# Patient Record
Sex: Female | Born: 1950
Health system: Southern US, Community
[De-identification: ages and names within clinical notes are randomized; demographics above are authoritative.]

## PROBLEM LIST (undated history)

## (undated) DIAGNOSIS — E039 Hypothyroidism, unspecified: Secondary | ICD-10-CM

## (undated) DIAGNOSIS — E785 Hyperlipidemia, unspecified: Secondary | ICD-10-CM

## (undated) DIAGNOSIS — F32A Depression, unspecified: Secondary | ICD-10-CM

## (undated) DIAGNOSIS — Z8679 Personal history of other diseases of the circulatory system: Secondary | ICD-10-CM

## (undated) HISTORY — DX: Hypothyroidism, unspecified: E03.9

## (undated) HISTORY — DX: Personal history of other diseases of the circulatory system: Z86.79

## (undated) HISTORY — DX: Depression, unspecified: F32.A

## (undated) HISTORY — DX: Hyperlipidemia, unspecified: E78.5

---

## 2000-02-03 ENCOUNTER — Other Ambulatory Visit: Admission: RE | Admit: 2000-02-03 | Discharge: 2000-02-03 | Payer: Self-pay | Admitting: Family Medicine

## 2000-02-12 ENCOUNTER — Ambulatory Visit (HOSPITAL_COMMUNITY): Admission: RE | Admit: 2000-02-12 | Discharge: 2000-02-12 | Payer: Self-pay | Admitting: Family Medicine

## 2000-02-12 ENCOUNTER — Encounter: Payer: Self-pay | Admitting: Family Medicine

## 2001-04-07 ENCOUNTER — Other Ambulatory Visit: Admission: RE | Admit: 2001-04-07 | Discharge: 2001-04-07 | Payer: Self-pay | Admitting: Family Medicine

## 2002-04-12 ENCOUNTER — Other Ambulatory Visit: Admission: RE | Admit: 2002-04-12 | Discharge: 2002-04-12 | Payer: Self-pay | Admitting: Family Medicine

## 2003-05-17 ENCOUNTER — Encounter: Payer: Self-pay | Admitting: Family Medicine

## 2003-05-17 ENCOUNTER — Ambulatory Visit (HOSPITAL_COMMUNITY): Admission: RE | Admit: 2003-05-17 | Discharge: 2003-05-17 | Payer: Self-pay | Admitting: Family Medicine

## 2003-06-12 ENCOUNTER — Other Ambulatory Visit: Admission: RE | Admit: 2003-06-12 | Discharge: 2003-06-12 | Payer: Self-pay | Admitting: Family Medicine

## 2004-07-11 ENCOUNTER — Other Ambulatory Visit: Admission: RE | Admit: 2004-07-11 | Discharge: 2004-07-11 | Payer: Self-pay | Admitting: Family Medicine

## 2005-07-16 ENCOUNTER — Other Ambulatory Visit: Admission: RE | Admit: 2005-07-16 | Discharge: 2005-07-16 | Payer: Self-pay | Admitting: Family Medicine

## 2006-04-21 ENCOUNTER — Ambulatory Visit (HOSPITAL_COMMUNITY): Admission: RE | Admit: 2006-04-21 | Discharge: 2006-04-21 | Payer: Self-pay | Admitting: Family Medicine

## 2006-08-06 ENCOUNTER — Other Ambulatory Visit: Admission: RE | Admit: 2006-08-06 | Discharge: 2006-08-06 | Payer: Self-pay | Admitting: Family Medicine

## 2007-11-08 ENCOUNTER — Other Ambulatory Visit: Admission: RE | Admit: 2007-11-08 | Discharge: 2007-11-08 | Payer: Self-pay | Admitting: Family Medicine

## 2008-11-08 ENCOUNTER — Other Ambulatory Visit: Admission: RE | Admit: 2008-11-08 | Discharge: 2008-11-08 | Payer: Self-pay | Admitting: Family Medicine

## 2009-11-21 ENCOUNTER — Other Ambulatory Visit: Admission: RE | Admit: 2009-11-21 | Discharge: 2009-11-21 | Payer: Self-pay | Admitting: Family Medicine

## 2011-03-26 ENCOUNTER — Inpatient Hospital Stay (INDEPENDENT_AMBULATORY_CARE_PROVIDER_SITE_OTHER)
Admission: RE | Admit: 2011-03-26 | Discharge: 2011-03-26 | Disposition: A | Discharge status: Home / Self Care | Payer: 59 | Source: Ambulatory Visit | Attending: Family Medicine | Admitting: Family Medicine

## 2011-03-26 DIAGNOSIS — J069 Acute upper respiratory infection, unspecified: Secondary | ICD-10-CM

## 2011-03-26 DIAGNOSIS — R509 Fever, unspecified: Secondary | ICD-10-CM

## 2012-08-25 ENCOUNTER — Other Ambulatory Visit: Payer: Self-pay | Admitting: Family Medicine

## 2012-08-25 ENCOUNTER — Other Ambulatory Visit (HOSPITAL_COMMUNITY)
Admission: RE | Admit: 2012-08-25 | Discharge: 2012-08-25 | Disposition: A | Discharge status: Home / Self Care | Payer: 59 | Source: Ambulatory Visit | Attending: Family Medicine | Admitting: Family Medicine

## 2012-08-25 DIAGNOSIS — Z124 Encounter for screening for malignant neoplasm of cervix: Secondary | ICD-10-CM | POA: Insufficient documentation

## 2013-12-21 ENCOUNTER — Other Ambulatory Visit (HOSPITAL_COMMUNITY): Payer: Self-pay | Admitting: Family Medicine

## 2013-12-21 DIAGNOSIS — E2839 Other primary ovarian failure: Secondary | ICD-10-CM

## 2013-12-21 DIAGNOSIS — R0989 Other specified symptoms and signs involving the circulatory and respiratory systems: Secondary | ICD-10-CM

## 2013-12-28 ENCOUNTER — Other Ambulatory Visit (HOSPITAL_COMMUNITY): Payer: 59

## 2013-12-28 ENCOUNTER — Ambulatory Visit (HOSPITAL_COMMUNITY): Payer: 59

## 2014-01-04 ENCOUNTER — Ambulatory Visit (HOSPITAL_COMMUNITY)
Admission: RE | Admit: 2014-01-04 | Discharge: 2014-01-04 | Disposition: A | Discharge status: Home / Self Care | Payer: 59 | Source: Ambulatory Visit | Attending: Family Medicine | Admitting: Family Medicine

## 2014-01-04 DIAGNOSIS — R0989 Other specified symptoms and signs involving the circulatory and respiratory systems: Secondary | ICD-10-CM | POA: Insufficient documentation

## 2014-01-04 DIAGNOSIS — I6529 Occlusion and stenosis of unspecified carotid artery: Secondary | ICD-10-CM | POA: Insufficient documentation

## 2015-03-22 ENCOUNTER — Other Ambulatory Visit: Payer: Self-pay | Admitting: Family Medicine

## 2015-03-22 ENCOUNTER — Other Ambulatory Visit (HOSPITAL_COMMUNITY)
Admission: RE | Admit: 2015-03-22 | Discharge: 2015-03-22 | Disposition: A | Discharge status: Home / Self Care | Payer: 59 | Source: Ambulatory Visit | Attending: Family Medicine | Admitting: Family Medicine

## 2015-03-22 DIAGNOSIS — Z124 Encounter for screening for malignant neoplasm of cervix: Secondary | ICD-10-CM | POA: Diagnosis not present

## 2015-03-23 LAB — CYTOLOGY - PAP

## 2015-09-26 ENCOUNTER — Ambulatory Visit: Payer: PPO | Attending: Sports Medicine | Admitting: Physical Therapy

## 2015-09-26 DIAGNOSIS — M25512 Pain in left shoulder: Secondary | ICD-10-CM | POA: Diagnosis not present

## 2015-09-26 DIAGNOSIS — M25612 Stiffness of left shoulder, not elsewhere classified: Secondary | ICD-10-CM

## 2015-09-26 DIAGNOSIS — M7582 Other shoulder lesions, left shoulder: Secondary | ICD-10-CM | POA: Diagnosis not present

## 2015-09-26 NOTE — Therapy (Signed)
[facility] [address] [place], [place], [address] Phone: [phone]   Fax:  [phone]  Physical Therapy Evaluation  Patient Details  Name: Erin Chavez MRN: [id number] Date of Birth: 1951-04-05 Referring Provider: Rodolph Bong MD.  Encounter Date: 09/26/2015      PT End of Session - 09/26/15 1302    Visit Number 1   Number of Visits 12   Date for PT Re-Evaluation 11/07/15   PT Start Time 1030   PT Stop Time 1119   PT Time Calculation (min) 49 min   Activity Tolerance Patient tolerated treatment well   Behavior During Therapy [facility] for tasks assessed/performed      No past medical history on file.  No past surgical history on file.  There were no vitals filed for this visit.  Visit Diagnosis:  Left shoulder pain - Plan: PT plan of care cert/re-cert  Decreased range of motion of left shoulder - Plan: PT plan of care cert/re-cert      Subjective Assessment - 09/26/15 1039    Subjective Left shoulder pain since october 2016.   Limitations --  Reaching.   Patient Stated Goals Get out of pain so i can move like i want to.   Currently in Pain? No/denies   Pain Score 0-No pain            [facility] PT Assessment - 09/26/15 0001    Assessment   Medical Diagnosis Left shoulder pain.   Referring Provider Rodolph Bong MD.   Onset Date/Surgical Date --  October 2016.   Precautions   Precautions None   Restrictions   Weight Bearing Restrictions No   Balance Screen   Has the patient fallen in the past 6 months No   Has the patient had a decrease in activity level because of a fear of falling?  No   Is the patient reluctant to leave their home because of a fear of falling?  No   Home Environment   Living Environment Private residence   Prior Function   Level of Independence Independent   Posture/Postural Control   Posture/Postural Control Postural limitations   Postural Limitations Rounded Shoulders;Forward head   ROM / Strength   AROM / PROM / Strength AROM;PROM;Strength   AROM   Overall AROM Comments Left shoulder flexion= 120 degrees; ER= 70 degrees and behind back to L5.   PROM   Overall PROM Comments Flexion= 140 degrees and ER= 80 degrees.   Strength   Overall Strength Comments Left shoulder IR/ER/Flex/ABD= 4/5.   Palpation   Palpation comment Tender to palpation at left acromial ridge with referred pain into left bicep region.   Special Tests    Special Tests Rotator Cuff Impingement   Rotator Cuff Impingment tests Neer impingement test;Hawkins- Kennedy test;Drop Arm test;Speed's test   Neer Impingement test    Findings Positive   Side Left   Hawkins-Kennedy test   Findings Positive   Side Left   Drop Arm test   Findings Negative   Side Left   Speed's test   Findings Negative   Side Left                   [facility] Adult PT Treatment/Exercise - 09/26/15 0001    Exercises   Exercises Shoulder   Modalities   Modalities Electrical Stimulation   Electrical Stimulation   Electrical Stimulation Location Left shoulder acromial ridge.   Electrical Stimulation Action 80-150 HZ x 15 minutes.  Electrical Stimulation Goals Pain                  PT Short Term Goals - 09/26/15 1252    PT SHORT TERM GOAL #1   Title Ind with HEP.   Time 2   Period Weeks   Status New           PT Long Term Goals - 09/26/15 1252    PT LONG TERM GOAL #1   Title Active left shoulder flexion to 145 degrees so she can reach overhead for an object.   Time 6   Period Weeks   Status New   PT LONG TERM GOAL #2   Title Behind back movement to L2 to make apparel donning/doffing easier.   Time 6   Period Weeks   Status New   PT LONG TERM GOAL #3   Title Left shoulder strength= 5/5 to increase left shoulder stability for functional tasks.   Time 6   Period Weeks   Status New   PT LONG TERM GOAL #4   Title Perform ADL's with pain not > 3/10.               Plan - 09/26/15  1040    Clinical Impression Statement October 2016 patient rolled over in bed and felt left shoulder pain.  No pain at rest but certain movements produce 8+/10.  Patient does not recall any specific injury.  pain radiates from her left shoulder to biceps region.   Pt will benefit from skilled therapeutic intervention in order to improve on the following deficits Pain;Decreased range of motion;Decreased activity tolerance   Rehab Potential Good   PT Frequency 2x / week   PT Duration 6 weeks   PT Treatment/Interventions ADLs/Self Care Home Management;Electrical Stimulation;Cryotherapy;Therapeutic exercise;Therapeutic activities;Ultrasound;Patient/family education;Manual techniques;Vasopneumatic Device;Passive range of motion   PT Next Visit Plan Modalities and STW/M to patient's left shoulder.  Please start with yellow theraband IR/ER...progress exercise with full can; sdly ER and scapular strengthening.  No pulleys but UR Ranger okay.   Consulted and Agree with Plan of Care Patient          G-Codes - 09/26/15 1254    Functional Assessment Tool Used FOTO...50% limitation.   Functional Limitation Other PT primary   Other PT Primary Current Status (W0981(G8990) At least 40 percent but less than 60 percent impaired, limited or restricted   Other PT Primary Goal Status (X9147(G8991) At least 20 percent but less than 40 percent impaired, limited or restricted       Problem List There are no active problems to display for this patient.   Tyreece Gelles, [place]  MPT  09/26/2015, 1:05 PM  [facility] [address] [place], [place], [address] Phone: [phone]   Fax:  [phone]  Name: Rollene FareVicki D Tisdel MRN: [id number] Date of Birth: 1951-06-26

## 2015-09-28 DIAGNOSIS — R609 Edema, unspecified: Secondary | ICD-10-CM | POA: Diagnosis not present

## 2015-10-02 ENCOUNTER — Encounter: Payer: Self-pay | Admitting: *Deleted

## 2015-10-02 ENCOUNTER — Ambulatory Visit: Payer: PPO | Admitting: *Deleted

## 2015-10-02 DIAGNOSIS — M25512 Pain in left shoulder: Secondary | ICD-10-CM | POA: Diagnosis not present

## 2015-10-02 DIAGNOSIS — M25612 Stiffness of left shoulder, not elsewhere classified: Secondary | ICD-10-CM

## 2015-10-02 NOTE — Therapy (Signed)
[facility] [address] [place], [place], [address] Phone: [phone]   Fax:  [phone]  Physical Therapy Treatment  Patient Details  Name: Rollene FareVicki D Kirst MRN: [id number] Date of Birth: 25-Jul-1951 Referring Provider: Rodolph BongAdam Kendall MD.  Encounter Date: 10/02/2015      PT End of Session - 10/02/15 1559    Visit Number 2   Number of Visits 12   Date for PT Re-Evaluation 11/07/15   PT Start Time 1033   PT Stop Time 1119   PT Time Calculation (min) 46 min      History reviewed. No pertinent past medical history.  History reviewed. No pertinent past surgical history.  There were no vitals filed for this visit.  Visit Diagnosis:  Left shoulder pain  Decreased range of motion of left shoulder      Subjective Assessment - 10/02/15 1034    Subjective Left shoulder pain since october 2016.  LT shldr hurts with certain movements   Currently in Pain? Yes   Pain Score 4    Pain Location Shoulder   Pain Orientation Left   Pain Type Acute pain   Pain Onset More than a month ago   Pain Frequency Intermittent                         [facility] Adult PT Treatment/Exercise - 10/02/15 0001    Exercises   Exercises Shoulder   Shoulder Exercises: Standing   External Rotation Strengthening;Left;Theraband  3x 10-15   Theraband Level (Shoulder External Rotation) Level 1 (Yellow)   Internal Rotation Strengthening;Left;Theraband  3x 10-15 reps   Theraband Level (Shoulder Internal Rotation) Level 1 (Yellow)   Modalities   Modalities Electrical Stimulation;Ultrasound;Moist Heat   Moist Heat Therapy   Number Minutes Moist Heat 15 Minutes   Moist Heat Location Shoulder   Electrical Stimulation   Electrical Stimulation Location Left shoulder acromial ridge Premod x 15 mins 80-150hz  in sitting   Electrical Stimulation Goals Pain   Ultrasound   Ultrasound Location LT shldr   Ultrasound Parameters 1.5 w/ cm2 x 10 mins   Ultrasound Goals Pain   Manual Therapy   Manual Therapy Myofascial release;Soft tissue mobilization   Myofascial Release IASTM? STW to LT shldr posteriolateral aspect in sitting                PT Education - 10/02/15 1046    Education provided Yes   Education Details ER/IR   Person(s) Educated Patient   Methods Explanation;Demonstration;Tactile cues;Verbal cues;Handout   Comprehension Verbalized understanding;Returned demonstration          PT Short Term Goals - 09/26/15 1252    PT SHORT TERM GOAL #1   Title Ind with HEP.   Time 2   Period Weeks   Status New           PT Long Term Goals - 09/26/15 1252    PT LONG TERM GOAL #1   Title Active left shoulder flexion to 145 degrees so she can reach overhead for an object.   Time 6   Period Weeks   Status New   PT LONG TERM GOAL #2   Title Behind back movement to L2 to make apparel donning/doffing easier.   Time 6   Period Weeks   Status New   PT LONG TERM GOAL #3   Title Left shoulder strength= 5/5 to increase left shoulder stability for functional tasks.   Time 6   Period  Weeks   Status New   PT LONG TERM GOAL #4   Title Perform ADL's with pain not > 3/10.               Plan - 10/02/15 1600    Clinical Impression Statement Pt did great with Rx today and was able to perform IR/ER with yellow Tband with minimal pain.    Pt will benefit from skilled therapeutic intervention in order to improve on the following deficits Pain;Decreased range of motion;Decreased activity tolerance   Rehab Potential Good   PT Frequency 2x / week   PT Duration 6 weeks   PT Treatment/Interventions ADLs/Self Care Home Management;Electrical Stimulation;Cryotherapy;Therapeutic exercise;Therapeutic activities;Ultrasound;Patient/family education;Manual techniques;Vasopneumatic Device;Passive range of motion   PT Next Visit Plan Modalities and STW/M to patient's left shoulder.  Please start with yellow theraband IR/ER...progress  exercise with full can; sdly ER and scapular strengthening.  No pulleys but UR Ranger okay.   Consulted and Agree with Plan of Care Patient        Problem List There are no active problems to display for this patient.   Aayansh Codispoti,CHRIS , PTA  10/02/2015, 5:37 PM  [facility] [address] [place], [place], [address] Phone: [phone]   Fax:  [phone]  Name: ANZLEE HINESLEY MRN: [id number] Date of Birth: [birth year]

## 2015-10-02 NOTE — Patient Instructions (Signed)
  Strengthening: Resisted Flexion   Hold tubing with left arm at side. Pull forward and up. Move shoulder through pain-free range of motion. Repeat __10__ times per set. Do _2-3___ sets per session. Do _2-3___ sessions per day. http://orth.exer.us/824   Copyright  VHI. All rights reserved.  Strengthening: Resisted Extension   Hold tubing in right hand, arm forward. Pull arm back, elbow straight. Repeat __10__ times per set. Do _2-3___ sets per session. Do _2-3___ sessions per day.  http://orth.exer.us/832   Copyright  VHI. All rights reserved.  Strengthening: Resisted Internal Rotation   Hold tubing in left hand, elbow at side and forearm out. Rotate forearm in across body. Repeat __10-15__ times per set. Do _2-3___ sets per session. Do _2__ sessions per day.  http://orth.exer.us/830   Copyright  VHI. All rights reserved.  Strengthening: Resisted External Rotation   Hold tubing in right hand, elbow at side and forearm across body. Rotate forearm out. Repeat __10-15__ times per set. Do __2-3__ sets per session. Do __2__ sessions per day.  http://orth.exer.us/828   Copyright  VHI. All rights reserved.

## 2015-10-04 ENCOUNTER — Ambulatory Visit: Payer: PPO | Admitting: Physical Therapy

## 2015-10-04 DIAGNOSIS — M25512 Pain in left shoulder: Secondary | ICD-10-CM

## 2015-10-04 DIAGNOSIS — M25612 Stiffness of left shoulder, not elsewhere classified: Secondary | ICD-10-CM

## 2015-10-04 NOTE — Therapy (Signed)
[facility] [address] [place], [place], [address] Phone: [phone]   Fax:  [phone]  Physical Therapy Treatment  Patient Details  Name: [name] MRN: [id number] Date of Birth: Oct 07, 1950 Referring Provider: Rodolph BongAdam Kendall MD.  Encounter Date: 10/04/2015      PT End of Session - 10/04/15 1139    Visit Number 3   Number of Visits 12   Date for PT Re-Evaluation 11/07/15   PT Start Time 1115   PT Stop Time 1209   PT Time Calculation (min) 54 min   Activity Tolerance Patient tolerated treatment well   Behavior During Therapy [facility] for tasks assessed/performed      No past medical history on file.  No past surgical history on file.  There were no vitals filed for this visit.  Visit Diagnosis:  Left shoulder pain  Decreased range of motion of left shoulder      Subjective Assessment - 10/04/15 1140    Subjective Doing okay.   Patient Stated Goals Get out of pain so i can move like i want to.   Pain Score 4    Pain Location Shoulder   Pain Orientation Left   Pain Type Acute pain   Pain Onset More than a month ago                         [facility]/Exercise - 10/04/15 0001    Shoulder Exercises: Standing   Theraband Level (Shoulder External Rotation) --  Yellow band 3 sets to fatigue.   Internal Rotation Limitations Yellow theraband ER 3 sets to faigue   Flexion Limitations 1# full can 3 sets to fatique.   Other Standing Exercises UE Ranger on wall flexion; CW and CCW x 5 minutes.   Shoulder Exercises: ROM/Strengthening   UBE (Upper Arm Bike) 8 mins at 120 RPM's   Electrical Stimulation   Electrical Stimulation Location --  Left shoulder.   Electrical Stimulation Action Constant pre-mod e'stim x 10 minutes at 80-150 HZ.   Ultrasound   Ultrasound Location Left shoulder acromial ridge   Ultrasound Parameters 1.50 w/CM2 x 7 minutes.   Ultrasound Goals Pain                   PT Short Term Goals - 09/26/15 1252    PT SHORT TERM GOAL #1   Title Ind with HEP.   Time 2   Period Weeks   Status New           PT Long Term Goals - 09/26/15 1252    PT LONG TERM GOAL #1   Title Active left shoulder flexion to 145 degrees so she can reach overhead for an object.   Time 6   Period Weeks   Status New   PT LONG TERM GOAL #2   Title Behind back movement to L2 to make apparel donning/doffing easier.   Time 6   Period Weeks   Status New   PT LONG TERM GOAL #3   Title Left shoulder strength= 5/5 to increase left shoulder stability for functional tasks.   Time 6   Period Weeks   Status New   PT LONG TERM GOAL #4   Title Perform ADL's with pain not > 3/10.               Problem List There are no active problems to display for this patient.   APPLEGATE, [place] MPT 10/04/2015,  12:14 PM  [facility] [address] [place], [place], [address] Phone: [phone]   Fax:  [phone]  Name: Erin Chavez MRN: [id number] Date of Birth: 04-23-1951

## 2015-10-09 ENCOUNTER — Ambulatory Visit: Payer: PPO | Admitting: *Deleted

## 2015-10-09 DIAGNOSIS — M25612 Stiffness of left shoulder, not elsewhere classified: Secondary | ICD-10-CM

## 2015-10-09 DIAGNOSIS — M25512 Pain in left shoulder: Secondary | ICD-10-CM | POA: Diagnosis not present

## 2015-10-09 NOTE — Therapy (Signed)
[facility] [address] [place], [place], [address] Phone: [phone]   Fax:  [phone]  Physical Therapy Treatment  Patient Details  Name: Erin Chavez MRN: [id number] Date of Birth: 1951/01/25 Referring Provider: Rodolph Bong MD.  Encounter Date: 10/09/2015      PT End of Session - 10/09/15 1304    Visit Number 4   Number of Visits 12   Date for PT Re-Evaluation 11/07/15   PT Start Time 1300   PT Stop Time 1359   PT Time Calculation (min) 59 min      No past medical history on file.  No past surgical history on file.  There were no vitals filed for this visit.  Visit Diagnosis:  Left shoulder pain  Decreased range of motion of left shoulder      Subjective Assessment - 10/09/15 1303    Subjective LT shldr 5/10 pain today   Patient Stated Goals Get out of pain so i can move like i want to.   Currently in Pain? Yes   Pain Score 5    Pain Location Shoulder   Pain Orientation Left   Pain Type Acute pain   Pain Onset More than a month ago   Pain Frequency Intermittent   Aggravating Factors  ADLs                         [facility] Adult PT Treatment/Exercise - 10/09/15 0001    Exercises   Exercises Shoulder   Shoulder Exercises: Standing   External Rotation Strengthening;Left;Theraband  3x 10-15   Theraband Level (Shoulder External Rotation) Level 1 (Yellow)   Internal Rotation Strengthening;Left;Theraband  3x 10-15 reps   Theraband Level (Shoulder Internal Rotation) Level 1 (Yellow)   Other Standing Exercises UE Ranger on wall flexion; CW and CCW x 5 minutes.   Shoulder Exercises: ROM/Strengthening   UBE (Upper Arm Bike) 8 mins at 120 RPM's   Modalities   Modalities Electrical Stimulation;Ultrasound;Moist Heat   Moist Heat Therapy   Number Minutes Moist Heat 15 Minutes   Moist Heat Location Shoulder   Electrical Stimulation   Electrical Stimulation Location Left shoulder acromial ridge  Premod x 15 mins 80-150hz  in sitting   Electrical Stimulation Goals Pain   Ultrasound   Ultrasound Location LT shldr   Ultrasound Parameters 1.5 w/cm2 x 8 mins   Ultrasound Goals Pain   Manual Therapy   Manual Therapy Passive ROM   Passive ROM PROM fo ER and elevation LT shldr                  PT Short Term Goals - 09/26/15 1252    PT SHORT TERM GOAL #1   Title Ind with HEP.   Time 2   Period Weeks   Status New           PT Long Term Goals - 09/26/15 1252    PT LONG TERM GOAL #1   Title Active left shoulder flexion to 145 degrees so she can reach overhead for an object.   Time 6   Period Weeks   Status New   PT LONG TERM GOAL #2   Title Behind back movement to L2 to make apparel donning/doffing easier.   Time 6   Period Weeks   Status New   PT LONG TERM GOAL #3   Title Left shoulder strength= 5/5 to increase left shoulder stability for functional tasks.   Time 6  Period Weeks   Status New   PT LONG TERM GOAL #4   Title Perform ADL's with pain not > 3/10.               Plan - 10/09/15 1305    Clinical Impression Statement Pt did fairly well with Rx today. She was able to  perform exs with minimal pain, except Flexion on UE ranger due to pain and tightness. PROM performed for ER and elevation and Pt was limited to about 110 degrees due pain. She feels like Rxs are helping, but still has pain with ADLs   Pt will benefit from skilled therapeutic intervention in order to improve on the following deficits Pain;Decreased range of motion;Decreased activity tolerance   Rehab Potential Good   PT Frequency 2x / week   PT Duration 6 weeks   PT Treatment/Interventions ADLs/Self Care Home Management;Electrical Stimulation;Cryotherapy;Therapeutic exercise;Therapeutic activities;Ultrasound;Patient/family education;Manual techniques;Vasopneumatic Device;Passive range of motion   PT Next Visit Plan Modalities and STW/M to patient's left shoulder.  Please start with  yellow theraband IR/ER...progress exercise with full can; sdly ER and scapular strengthening.  No pulleys but UR Ranger okay.   Consulted and Agree with Plan of Care Patient        Problem List There are no active problems to display for this patient.   Sharlyne Koeneman,CHRIS, PTA 10/09/2015, 3:24 PM  [facility] [address] [place], [place], [address] Phone: [phone]   Fax:  [phone]  Name: Erin Chavez MRN: [id number] Date of Birth: Apr 28, 1951

## 2015-10-11 ENCOUNTER — Ambulatory Visit: Payer: PPO | Admitting: Physical Therapy

## 2015-10-11 DIAGNOSIS — M25512 Pain in left shoulder: Secondary | ICD-10-CM

## 2015-10-11 DIAGNOSIS — M25612 Stiffness of left shoulder, not elsewhere classified: Secondary | ICD-10-CM

## 2015-10-11 NOTE — Therapy (Signed)
[facility] [address] [place], [place], [address] Phone: [phone]   Fax:  [phone]  Physical Therapy Treatment  Patient Details  Name: Erin Chavez MRN: [id number] Date of Birth: 29-Dec-1950 Referring Provider: Rodolph Bong MD.  Encounter Date: 10/11/2015      PT End of Session - 10/11/15 1305    Visit Number 5   Number of Visits 12   Date for PT Re-Evaluation 11/07/15   PT Start Time 1303   PT Stop Time 1351   PT Time Calculation (min) 48 min   Activity Tolerance Patient tolerated treatment well   Behavior During Therapy [facility] for tasks assessed/performed      No past medical history on file.  No past surgical history on file.  There were no vitals filed for this visit.  Visit Diagnosis:  Left shoulder pain  Decreased range of motion of left shoulder      Subjective Assessment - 10/11/15 1304    Subjective "feels pretty good today."   Patient Stated Goals Get out of pain so i can move like i want to.   Currently in Pain? Yes   Pain Score 3    Pain Location Shoulder   Pain Orientation Left   Pain Type Acute pain   Pain Onset More than a month ago            [facility] PT Assessment - 10/11/15 0001    Assessment   Medical Diagnosis Left shoulder pain.   Next MD Visit 10/22/2015   Precautions   Precautions None                     [facility] Adult PT Treatment/Exercise - 10/11/15 0001    Shoulder Exercises: Standing   External Rotation Strengthening;Left;Theraband  3x10 reps   Theraband Level (Shoulder External Rotation) Level 1 (Yellow)   Internal Rotation Strengthening;Left;Theraband  3x10 reps   Theraband Level (Shoulder Internal Rotation) Level 1 (Yellow)   Flexion AROM;Left  x30 reps   Extension Strengthening;Left;Theraband  3x10 reps   Theraband Level (Shoulder Extension) Level 1 (Yellow)   Row Strengthening;Left;Theraband;5 reps  Experienced pain anterior L shoulder   Theraband  Level (Shoulder Row) Level 1 (Yellow)   Other Standing Exercises UE Ranger on wall flex, CW, CCW x20 reps each   Other Standing Exercises L shoulder scaption AROM x30 reps   Shoulder Exercises: ROM/Strengthening   UBE (Upper Arm Bike) 8 mins at 120 RPM's   Modalities   Modalities Electrical Stimulation;Moist Heat   Moist Heat Therapy   Number Minutes Moist Heat 15 Minutes   Moist Heat Location Shoulder   Electrical Stimulation   Electrical Stimulation Location L shoulder   Electrical Stimulation Action IFC   Electrical Stimulation Parameters 80-150 Hz x15 min   Electrical Stimulation Goals Pain                  PT Short Term Goals - 10/11/15 1341    PT SHORT TERM GOAL #1   Title Ind with HEP.   Time 2   Period Weeks   Status Achieved           PT Long Term Goals - 09/26/15 1252    PT LONG TERM GOAL #1   Title Active left shoulder flexion to 145 degrees so she can reach overhead for an object.   Time 6   Period Weeks   Status New   PT LONG TERM GOAL #2   Title Behind  back movement to L2 to make apparel donning/doffing easier.   Time 6   Period Weeks   Status New   PT LONG TERM GOAL #3   Title Left shoulder strength= 5/5 to increase left shoulder stability for functional tasks.   Time 6   Period Weeks   Status New   PT LONG TERM GOAL #4   Title Perform ADL's with pain not > 3/10.               Plan - 10/11/15 1346    Clinical Impression Statement Patient tolerated today's treatment well with only complaint of pain with attempt at strengthening row with yellow theraband. Patient had no other complaints of pain and did very well with AROM exercises. The pain with the R shoulder during theraband rows was described as anterior. Normal modaliities response noted following removal of the modaliites. Patient experienced R shoulder feeling "good" following today's treatment.   Pt will benefit from skilled therapeutic intervention in order to improve on the  following deficits Pain;Decreased range of motion;Decreased activity tolerance   Rehab Potential Good   PT Frequency 2x / week   PT Duration 6 weeks   PT Treatment/Interventions ADLs/Self Care Home Management;Electrical Stimulation;Cryotherapy;Therapeutic exercise;Therapeutic activities;Ultrasound;Patient/family education;Manual techniques;Vasopneumatic Device;Passive range of motion   PT Next Visit Plan Continue with ROM and strengthening per MPT POC with modalities PRN.   PT Home Exercise Plan HEP- IR/ER yellow theraband, full can   Consulted and Agree with Plan of Care Patient        Problem List There are no active problems to display for this patient.   Evelene Croon, PTA 10/11/2015, 2:02 PM  [facility] [address] [place], [place], [address] Phone: [phone]   Fax:  [phone]  Name: Erin Chavez MRN: [id number] Date of Birth: [birth year]

## 2015-10-16 ENCOUNTER — Ambulatory Visit: Payer: PPO | Admitting: *Deleted

## 2015-10-16 DIAGNOSIS — M25512 Pain in left shoulder: Secondary | ICD-10-CM | POA: Diagnosis not present

## 2015-10-16 DIAGNOSIS — M25612 Stiffness of left shoulder, not elsewhere classified: Secondary | ICD-10-CM

## 2015-10-16 NOTE — Therapy (Signed)
[facility] [address] [place], [place], [address] Phone: [phone]   Fax:  [phone]  Physical Therapy Treatment  Patient Details  Name: Erin Chavez MRN: [id number] Date of Birth: 1950-10-23 Referring Provider: Rodolph Bong MD.  Encounter Date: 10/16/2015      PT End of Session - 10/16/15 1350    Visit Number 6   Number of Visits 12   Date for PT Re-Evaluation 11/07/15   PT Start Time 1300   PT Stop Time 1351   PT Time Calculation (min) 51 min      No past medical history on file.  No past surgical history on file.  There were no vitals filed for this visit.  Visit Diagnosis:  Left shoulder pain  Decreased range of motion of left shoulder      Subjective Assessment - 10/16/15 1314    Subjective LT shldr feels better the last few days.   Patient Stated Goals Get out of pain so i can move like i want to.   Currently in Pain? Yes   Pain Score 2    Pain Location Shoulder   Pain Orientation Left   Pain Descriptors / Indicators Sore   Pain Onset More than a month ago   Pain Frequency Intermittent   Aggravating Factors  ADLs                         [facility] Adult PT Treatment/Exercise - 10/16/15 0001    Exercises   Exercises Shoulder   Shoulder Exercises: Standing   External Rotation Strengthening;Left;Theraband  3x10 reps   Theraband Level (Shoulder External Rotation) Level 1 (Yellow)   Internal Rotation Strengthening;Left;Theraband  3x10 reps   Theraband Level (Shoulder Internal Rotation) Level 1 (Yellow)   Flexion AROM;Left  x30 reps   Extension Strengthening;Left;Theraband  3x10 reps   Theraband Level (Shoulder Extension) Level 1 (Yellow)   Row Strengthening;Left;Theraband;5 reps  Experienced pain anterior L shoulder   Theraband Level (Shoulder Row) Level 1 (Yellow)   Other Standing Exercises UE Ranger on wall flex, CW, CCW x20 reps each   Other Standing Exercises L shoulder  scaption AROM x30 reps   Shoulder Exercises: ROM/Strengthening   UBE (Upper Arm Bike) 8 mins at 120 RPM's   Modalities   Modalities Electrical Stimulation;Moist Heat   Moist Heat Therapy   Number Minutes Moist Heat 15 Minutes   Moist Heat Location Shoulder   Electrical Stimulation   Electrical Stimulation Location Left shoulder acromial ridge Premod x 15 mins 80-150hz  in sitting   Electrical Stimulation Goals Pain   Ultrasound   Ultrasound Location LT shldr   Ultrasound Parameters 1.5 w/cm2 x 10 mins   Ultrasound Goals Pain                  PT Short Term Goals - 10/11/15 1341    PT SHORT TERM GOAL #1   Title Ind with HEP.   Time 2   Period Weeks   Status Achieved           PT Long Term Goals - 09/26/15 1252    PT LONG TERM GOAL #1   Title Active left shoulder flexion to 145 degrees so she can reach overhead for an object.   Time 6   Period Weeks   Status New   PT LONG TERM GOAL #2   Title Behind back movement to L2 to make apparel donning/doffing easier.   Time 6  Period Weeks   Status New   PT LONG TERM GOAL #3   Title Left shoulder strength= 5/5 to increase left shoulder stability for functional tasks.   Time 6   Period Weeks   Status New   PT LONG TERM GOAL #4   Title Perform ADL's with pain not > 3/10.               Plan - 10/16/15 1351    Clinical Impression Statement Pt did great today with exs and modalities for LT shldr. Her ROM has improved  and the use of  LT  UE  is becoming less painful. Her IR and HBB is the most limited now due to tightness and pain. Goals are ongoing    Pt will benefit from skilled therapeutic intervention in order to improve on the following deficits Pain;Decreased range of motion;Decreased activity tolerance   Rehab Potential Good   PT Frequency 2x / week   PT Duration 6 weeks   PT Treatment/Interventions ADLs/Self Care Home Management;Electrical Stimulation;Cryotherapy;Therapeutic exercise;Therapeutic  activities;Ultrasound;Patient/family education;Manual techniques;Vasopneumatic Device;Passive range of motion   PT Next Visit Plan Continue with ROM and strengthening per MPT POC with modalities PRN.   PT Home Exercise Plan HEP- IR/ER yellow theraband, full can   Consulted and Agree with Plan of Care Patient        Problem List There are no active problems to display for this patient.   Delquan Poucher,CHRIS, PTA 10/16/2015, 1:59 PM  [facility] [address] [place], [place], [address] Phone: [phone]   Fax:  [phone]  Name: Erin Chavez MRN: [id number] Date of Birth: 11/07/1950

## 2015-10-18 ENCOUNTER — Ambulatory Visit: Payer: PPO | Admitting: Physical Therapy

## 2015-10-18 DIAGNOSIS — M25612 Stiffness of left shoulder, not elsewhere classified: Secondary | ICD-10-CM

## 2015-10-18 DIAGNOSIS — M25512 Pain in left shoulder: Secondary | ICD-10-CM | POA: Diagnosis not present

## 2015-10-18 NOTE — Therapy (Addendum)
[facility] [place], [place], [address] Phone: [phone]   Fax:  [phone]  Physical Therapy Treatment  Patient Details  Name: Erin Chavez MRN: [id number] Date of Birth: 04/16/1951 Referring Provider: Vickki Hearing MD.  Encounter Date: 10/18/2015      PT End of Session - 10/18/15 1302    Visit Number 7   Number of Visits 12   Date for PT Re-Evaluation 11/07/15   PT Start Time 1301   PT Stop Time 1344   PT Time Calculation (min) 43 min   Activity Tolerance Patient tolerated treatment well   Behavior During Therapy [facility] for tasks assessed/performed      No past medical history on file.  No past surgical history on file.  There were no vitals filed for this visit.  Visit Diagnosis:  Left shoulder pain  Decreased range of motion of left shoulder      Subjective Assessment - 10/18/15 1302    Subjective Reports that she has been trying to get her hand behind her back since that is her greatest difficulty and states that her arm is a little sore. Reports that she wsa not hurting prior to trying to reach behind her back today.   Patient Stated Goals Get out of pain so i can move like i want to.   Currently in Pain? Yes   Pain Score 3    Pain Location Shoulder   Pain Orientation Left   Pain Descriptors / Indicators Sore   Pain Type Acute pain   Pain Onset More than a month ago   Pain Frequency Intermittent            [facility] PT Assessment - 10/18/15 0001    Assessment   Medical Diagnosis Left shoulder pain.   Next MD Visit 10/22/2015   Precautions   Precautions None   ROM / Strength   AROM / PROM / Strength AROM   AROM   Overall AROM  Deficits   AROM Assessment Site Shoulder   Right/Left Shoulder Left   Left Shoulder Flexion 130 Degrees   Left Shoulder ABduction 90 Degrees   scaption   Left Shoulder Internal Rotation 63 Degrees   Left Shoulder External Rotation 50 Degrees                      [facility] Adult PT Treatment/Exercise - 10/18/15 0001    Shoulder Exercises: Standing   Horizontal ABduction Strengthening;Both;20 reps;Theraband   Theraband Level (Shoulder Horizontal ABduction) Level 2 (Red)   External Rotation Strengthening;Left;Theraband  x30 reps; B shoulder ER red theraband x30 reps   Theraband Level (Shoulder External Rotation) Level 1 (Yellow)   Internal Rotation Strengthening;Left;Theraband  x30 reps   Theraband Level (Shoulder Internal Rotation) Level 1 (Yellow)   Flexion Strengthening;Both;Weights  x30 reps   Shoulder Flexion Weight (lbs) 1   Extension Strengthening;Left;Theraband  x30 reps   Theraband Level (Shoulder Extension) Level 1 (Yellow)   Other Standing Exercises UE Ranger on wall flex, CW, CCW x20 reps each   Other Standing Exercises B shoulder scaption 1# x30 reps   Shoulder Exercises: ROM/Strengthening   UBE (Upper Arm Bike) 8 mins at 120 RPM's   Modalities   Modalities Electrical Stimulation;Moist Heat   Moist Heat Therapy   Number Minutes Moist Heat 15 Minutes   Moist Heat Location Shoulder   Electrical Stimulation   Electrical Stimulation Location L shoulder   Electrical Stimulation Action Pre-Mod   Electrical Stimulation  Parameters 80-150 Hz x15 min   Electrical Stimulation Goals Pain                  PT Short Term Goals - 10/11/15 1341    PT SHORT TERM GOAL #1   Title Ind with HEP.   Time 2   Period Weeks   Status Achieved           PT Long Term Goals - 10/18/15 1352    PT LONG TERM GOAL #1   Title Active left shoulder flexion to 145 degrees so she can reach overhead for an object.   Time 6   Period Weeks   Status On-going  AROM L shoulder flex 130 deg 10/18/2015   PT LONG TERM GOAL #2   Title Behind back movement to L2 to make apparel donning/doffing easier.   Time 6   Period Weeks   Status On-going  Reaches low back but has anterior L shoulder stinging 10/18/2015   PT LONG  TERM GOAL #3   Title Left shoulder strength= 5/5 to increase left shoulder stability for functional tasks.   Time 6   Period Weeks   Status On-going   PT LONG TERM GOAL #4   Title Perform ADL's with pain not > 3/10.   Status On-going               Plan - 10/18/15 1338    Clinical Impression Statement Patient tolerated today's treatment well with  no reports of increased L shoulder pain with any exercises. Able to tolerate resistance with exercises as well without complaint of pain or soreness. L shoulder AROM flexion measurment improved to 130 deg but AROM ER has decreased since evaluation. Normal modalites response noted following removal of the modalities. L shoulder felt "good" following today's treatment and moist heat per patient report.   Pt will benefit from skilled therapeutic intervention in order to improve on the following deficits Pain;Decreased range of motion;Decreased activity tolerance   Rehab Potential Good   PT Frequency 2x / week   PT Duration 6 weeks   PT Treatment/Interventions ADLs/Self Care Home Management;Electrical Stimulation;Cryotherapy;Therapeutic exercise;Therapeutic activities;Ultrasound;Patient/family education;Manual techniques;Vasopneumatic Device;Passive range of motion   PT Next Visit Plan Continue with ROM and strengthening per MPT POC with modalities PRN.   PT Home Exercise Plan HEP- IR/ER yellow theraband, full can   Consulted and Agree with Plan of Care Patient        Problem List There are no active problems to display for this patient.   Wynelle Fanny, PTA 10/18/2015, 1:56 PM  [facility] [address] [place], [place], [address] Phone: [phone]   Fax:  [phone]  Name: Erin Chavez MRN: [id number] Date of Birth: 09/01/1951  PHYSICAL THERAPY DISCHARGE SUMMARY  Visits from Start of Care: 7.  Current functional level related to goals / functional outcomes: See above.    Remaining deficits: Continued left shoulder pain, loss of ROM and strength.   Education / Equipment: HEP. Plan: Patient agrees to discharge.  Patient goals were not met. Patient is being discharged due to not returning since the last visit.  ?????         Mali Applegate MPT

## 2015-10-22 DIAGNOSIS — M7582 Other shoulder lesions, left shoulder: Secondary | ICD-10-CM | POA: Diagnosis not present

## 2015-10-22 DIAGNOSIS — M25512 Pain in left shoulder: Secondary | ICD-10-CM | POA: Diagnosis not present

## 2015-11-15 DIAGNOSIS — E039 Hypothyroidism, unspecified: Secondary | ICD-10-CM | POA: Diagnosis not present

## 2015-11-15 DIAGNOSIS — Z0000 Encounter for general adult medical examination without abnormal findings: Secondary | ICD-10-CM | POA: Diagnosis not present

## 2015-11-15 DIAGNOSIS — I10 Essential (primary) hypertension: Secondary | ICD-10-CM | POA: Diagnosis not present

## 2015-11-15 DIAGNOSIS — E782 Mixed hyperlipidemia: Secondary | ICD-10-CM | POA: Diagnosis not present

## 2015-11-15 DIAGNOSIS — E669 Obesity, unspecified: Secondary | ICD-10-CM | POA: Diagnosis not present

## 2015-11-15 DIAGNOSIS — R7309 Other abnormal glucose: Secondary | ICD-10-CM | POA: Diagnosis not present

## 2015-11-15 DIAGNOSIS — Z6831 Body mass index (BMI) 31.0-31.9, adult: Secondary | ICD-10-CM | POA: Diagnosis not present

## 2016-03-20 DIAGNOSIS — Z1239 Encounter for other screening for malignant neoplasm of breast: Secondary | ICD-10-CM | POA: Diagnosis not present

## 2016-03-20 DIAGNOSIS — E559 Vitamin D deficiency, unspecified: Secondary | ICD-10-CM | POA: Diagnosis not present

## 2016-03-20 DIAGNOSIS — E7800 Pure hypercholesterolemia, unspecified: Secondary | ICD-10-CM | POA: Diagnosis not present

## 2016-03-20 DIAGNOSIS — Z1389 Encounter for screening for other disorder: Secondary | ICD-10-CM | POA: Diagnosis not present

## 2016-03-20 DIAGNOSIS — Z1211 Encounter for screening for malignant neoplasm of colon: Secondary | ICD-10-CM | POA: Diagnosis not present

## 2016-03-20 DIAGNOSIS — E039 Hypothyroidism, unspecified: Secondary | ICD-10-CM | POA: Diagnosis not present

## 2016-03-20 DIAGNOSIS — R7301 Impaired fasting glucose: Secondary | ICD-10-CM | POA: Diagnosis not present

## 2016-03-20 DIAGNOSIS — Z0000 Encounter for general adult medical examination without abnormal findings: Secondary | ICD-10-CM | POA: Diagnosis not present

## 2016-03-20 DIAGNOSIS — I10 Essential (primary) hypertension: Secondary | ICD-10-CM | POA: Diagnosis not present

## 2016-03-20 DIAGNOSIS — Z79899 Other long term (current) drug therapy: Secondary | ICD-10-CM | POA: Diagnosis not present

## 2016-07-01 ENCOUNTER — Other Ambulatory Visit (HOSPITAL_COMMUNITY): Payer: Self-pay | Admitting: Family Medicine

## 2016-07-01 DIAGNOSIS — Z1231 Encounter for screening mammogram for malignant neoplasm of breast: Secondary | ICD-10-CM

## 2016-07-04 ENCOUNTER — Ambulatory Visit (HOSPITAL_COMMUNITY)
Admission: RE | Admit: 2016-07-04 | Discharge: 2016-07-04 | Disposition: A | Discharge status: Home / Self Care | Payer: PPO | Source: Ambulatory Visit | Attending: Family Medicine | Admitting: Family Medicine

## 2016-07-04 ENCOUNTER — Encounter (HOSPITAL_COMMUNITY): Payer: Self-pay | Admitting: Radiology

## 2016-07-04 DIAGNOSIS — Z1231 Encounter for screening mammogram for malignant neoplasm of breast: Secondary | ICD-10-CM | POA: Insufficient documentation

## 2016-09-26 DIAGNOSIS — E039 Hypothyroidism, unspecified: Secondary | ICD-10-CM | POA: Diagnosis not present

## 2016-09-26 DIAGNOSIS — E559 Vitamin D deficiency, unspecified: Secondary | ICD-10-CM | POA: Diagnosis not present

## 2016-09-26 DIAGNOSIS — I10 Essential (primary) hypertension: Secondary | ICD-10-CM | POA: Diagnosis not present

## 2016-09-26 DIAGNOSIS — E7800 Pure hypercholesterolemia, unspecified: Secondary | ICD-10-CM | POA: Diagnosis not present

## 2016-09-26 DIAGNOSIS — R7301 Impaired fasting glucose: Secondary | ICD-10-CM | POA: Diagnosis not present

## 2017-03-31 DIAGNOSIS — R7301 Impaired fasting glucose: Secondary | ICD-10-CM | POA: Diagnosis not present

## 2017-03-31 DIAGNOSIS — Z23 Encounter for immunization: Secondary | ICD-10-CM | POA: Diagnosis not present

## 2017-03-31 DIAGNOSIS — E7800 Pure hypercholesterolemia, unspecified: Secondary | ICD-10-CM | POA: Diagnosis not present

## 2017-03-31 DIAGNOSIS — Z1211 Encounter for screening for malignant neoplasm of colon: Secondary | ICD-10-CM | POA: Diagnosis not present

## 2017-03-31 DIAGNOSIS — E559 Vitamin D deficiency, unspecified: Secondary | ICD-10-CM | POA: Diagnosis not present

## 2017-03-31 DIAGNOSIS — Z1389 Encounter for screening for other disorder: Secondary | ICD-10-CM | POA: Diagnosis not present

## 2017-03-31 DIAGNOSIS — E039 Hypothyroidism, unspecified: Secondary | ICD-10-CM | POA: Diagnosis not present

## 2017-03-31 DIAGNOSIS — I10 Essential (primary) hypertension: Secondary | ICD-10-CM | POA: Diagnosis not present

## 2017-03-31 DIAGNOSIS — E2839 Other primary ovarian failure: Secondary | ICD-10-CM | POA: Diagnosis not present

## 2017-03-31 DIAGNOSIS — Z0000 Encounter for general adult medical examination without abnormal findings: Secondary | ICD-10-CM | POA: Diagnosis not present

## 2017-05-27 DIAGNOSIS — M8588 Other specified disorders of bone density and structure, other site: Secondary | ICD-10-CM | POA: Diagnosis not present

## 2017-05-27 DIAGNOSIS — E2839 Other primary ovarian failure: Secondary | ICD-10-CM | POA: Diagnosis not present

## 2018-04-20 DIAGNOSIS — Z0000 Encounter for general adult medical examination without abnormal findings: Secondary | ICD-10-CM | POA: Diagnosis not present

## 2018-04-20 DIAGNOSIS — Z1159 Encounter for screening for other viral diseases: Secondary | ICD-10-CM | POA: Diagnosis not present

## 2018-04-20 DIAGNOSIS — E7800 Pure hypercholesterolemia, unspecified: Secondary | ICD-10-CM | POA: Diagnosis not present

## 2018-04-20 DIAGNOSIS — Z1211 Encounter for screening for malignant neoplasm of colon: Secondary | ICD-10-CM | POA: Diagnosis not present

## 2018-04-20 DIAGNOSIS — I10 Essential (primary) hypertension: Secondary | ICD-10-CM | POA: Diagnosis not present

## 2018-04-20 DIAGNOSIS — E039 Hypothyroidism, unspecified: Secondary | ICD-10-CM | POA: Diagnosis not present

## 2018-04-20 DIAGNOSIS — R7301 Impaired fasting glucose: Secondary | ICD-10-CM | POA: Diagnosis not present

## 2018-04-20 DIAGNOSIS — E559 Vitamin D deficiency, unspecified: Secondary | ICD-10-CM | POA: Diagnosis not present

## 2019-04-07 ENCOUNTER — Other Ambulatory Visit (HOSPITAL_COMMUNITY): Payer: Self-pay | Admitting: Family Medicine

## 2019-04-07 DIAGNOSIS — Z1231 Encounter for screening mammogram for malignant neoplasm of breast: Secondary | ICD-10-CM

## 2019-04-13 ENCOUNTER — Other Ambulatory Visit: Payer: Self-pay

## 2019-04-13 ENCOUNTER — Ambulatory Visit (HOSPITAL_COMMUNITY)
Admission: RE | Admit: 2019-04-13 | Discharge: 2019-04-13 | Disposition: A | Discharge status: Home / Self Care | Payer: PPO | Source: Ambulatory Visit | Attending: Family Medicine | Admitting: Family Medicine

## 2019-04-13 ENCOUNTER — Encounter (HOSPITAL_COMMUNITY): Payer: Self-pay

## 2019-04-13 DIAGNOSIS — Z1231 Encounter for screening mammogram for malignant neoplasm of breast: Secondary | ICD-10-CM | POA: Diagnosis not present

## 2019-05-05 DIAGNOSIS — Z6826 Body mass index (BMI) 26.0-26.9, adult: Secondary | ICD-10-CM | POA: Diagnosis not present

## 2019-05-05 DIAGNOSIS — M8588 Other specified disorders of bone density and structure, other site: Secondary | ICD-10-CM | POA: Diagnosis not present

## 2019-05-05 DIAGNOSIS — Z0000 Encounter for general adult medical examination without abnormal findings: Secondary | ICD-10-CM | POA: Diagnosis not present

## 2019-05-05 DIAGNOSIS — I10 Essential (primary) hypertension: Secondary | ICD-10-CM | POA: Diagnosis not present

## 2019-05-09 DIAGNOSIS — R7301 Impaired fasting glucose: Secondary | ICD-10-CM | POA: Diagnosis not present

## 2019-05-09 DIAGNOSIS — E559 Vitamin D deficiency, unspecified: Secondary | ICD-10-CM | POA: Diagnosis not present

## 2019-05-09 DIAGNOSIS — E7800 Pure hypercholesterolemia, unspecified: Secondary | ICD-10-CM | POA: Diagnosis not present

## 2019-05-09 DIAGNOSIS — E039 Hypothyroidism, unspecified: Secondary | ICD-10-CM | POA: Diagnosis not present

## 2019-05-09 DIAGNOSIS — I10 Essential (primary) hypertension: Secondary | ICD-10-CM | POA: Diagnosis not present

## 2019-05-11 ENCOUNTER — Other Ambulatory Visit: Payer: Self-pay | Admitting: Physician Assistant

## 2019-05-11 DIAGNOSIS — Z1211 Encounter for screening for malignant neoplasm of colon: Secondary | ICD-10-CM | POA: Diagnosis not present

## 2019-05-11 DIAGNOSIS — Z1212 Encounter for screening for malignant neoplasm of rectum: Secondary | ICD-10-CM | POA: Diagnosis not present

## 2019-05-11 DIAGNOSIS — M8580 Other specified disorders of bone density and structure, unspecified site: Secondary | ICD-10-CM

## 2019-07-14 ENCOUNTER — Other Ambulatory Visit: Payer: Self-pay

## 2019-07-14 ENCOUNTER — Ambulatory Visit
Admission: RE | Admit: 2019-07-14 | Discharge: 2019-07-14 | Disposition: A | Discharge status: Home / Self Care | Payer: PPO | Source: Ambulatory Visit | Attending: Physician Assistant | Admitting: Physician Assistant

## 2019-07-14 DIAGNOSIS — M85852 Other specified disorders of bone density and structure, left thigh: Secondary | ICD-10-CM | POA: Diagnosis not present

## 2019-07-14 DIAGNOSIS — M8580 Other specified disorders of bone density and structure, unspecified site: Secondary | ICD-10-CM

## 2019-07-14 DIAGNOSIS — Z780 Asymptomatic menopausal state: Secondary | ICD-10-CM | POA: Diagnosis not present

## 2019-11-16 DIAGNOSIS — Z23 Encounter for immunization: Secondary | ICD-10-CM | POA: Diagnosis not present

## 2020-01-26 DIAGNOSIS — H1013 Acute atopic conjunctivitis, bilateral: Secondary | ICD-10-CM | POA: Diagnosis not present

## 2020-01-26 DIAGNOSIS — R6889 Other general symptoms and signs: Secondary | ICD-10-CM | POA: Diagnosis not present

## 2020-01-26 DIAGNOSIS — H04203 Unspecified epiphora, bilateral lacrimal glands: Secondary | ICD-10-CM | POA: Diagnosis not present

## 2020-03-19 ENCOUNTER — Other Ambulatory Visit (HOSPITAL_COMMUNITY): Payer: Self-pay | Admitting: Family Medicine

## 2020-03-19 DIAGNOSIS — Z1231 Encounter for screening mammogram for malignant neoplasm of breast: Secondary | ICD-10-CM

## 2020-04-13 ENCOUNTER — Ambulatory Visit (HOSPITAL_COMMUNITY)
Admission: RE | Admit: 2020-04-13 | Discharge: 2020-04-13 | Disposition: A | Discharge status: Home / Self Care | Payer: PPO | Source: Ambulatory Visit | Attending: Family Medicine | Admitting: Family Medicine

## 2020-04-13 ENCOUNTER — Other Ambulatory Visit: Payer: Self-pay

## 2020-04-13 DIAGNOSIS — Z1231 Encounter for screening mammogram for malignant neoplasm of breast: Secondary | ICD-10-CM | POA: Diagnosis not present

## 2020-05-10 DIAGNOSIS — E7800 Pure hypercholesterolemia, unspecified: Secondary | ICD-10-CM | POA: Diagnosis not present

## 2020-05-10 DIAGNOSIS — E039 Hypothyroidism, unspecified: Secondary | ICD-10-CM | POA: Diagnosis not present

## 2020-05-10 DIAGNOSIS — E559 Vitamin D deficiency, unspecified: Secondary | ICD-10-CM | POA: Diagnosis not present

## 2020-05-10 DIAGNOSIS — R7303 Prediabetes: Secondary | ICD-10-CM | POA: Diagnosis not present

## 2020-08-31 DIAGNOSIS — E7800 Pure hypercholesterolemia, unspecified: Secondary | ICD-10-CM | POA: Diagnosis not present

## 2021-05-14 DIAGNOSIS — M25512 Pain in left shoulder: Secondary | ICD-10-CM | POA: Diagnosis not present

## 2021-05-14 DIAGNOSIS — R7303 Prediabetes: Secondary | ICD-10-CM | POA: Diagnosis not present

## 2021-05-14 DIAGNOSIS — E039 Hypothyroidism, unspecified: Secondary | ICD-10-CM | POA: Diagnosis not present

## 2021-05-14 DIAGNOSIS — Z79899 Other long term (current) drug therapy: Secondary | ICD-10-CM | POA: Diagnosis not present

## 2021-05-14 DIAGNOSIS — R5383 Other fatigue: Secondary | ICD-10-CM | POA: Diagnosis not present

## 2021-05-14 DIAGNOSIS — E559 Vitamin D deficiency, unspecified: Secondary | ICD-10-CM | POA: Diagnosis not present

## 2021-05-14 DIAGNOSIS — E7800 Pure hypercholesterolemia, unspecified: Secondary | ICD-10-CM | POA: Diagnosis not present

## 2021-05-14 DIAGNOSIS — M8580 Other specified disorders of bone density and structure, unspecified site: Secondary | ICD-10-CM | POA: Diagnosis not present

## 2021-05-14 DIAGNOSIS — R030 Elevated blood-pressure reading, without diagnosis of hypertension: Secondary | ICD-10-CM | POA: Diagnosis not present

## 2021-06-26 ENCOUNTER — Encounter: Payer: Self-pay | Admitting: *Deleted

## 2021-06-27 ENCOUNTER — Ambulatory Visit: Payer: PPO | Admitting: Cardiology

## 2021-06-27 ENCOUNTER — Encounter: Payer: Self-pay | Admitting: Cardiology

## 2021-06-27 ENCOUNTER — Other Ambulatory Visit: Payer: Self-pay

## 2021-06-27 VITALS — BP 124/68 | HR 80 | Ht 66.5 in | Wt 186.0 lb

## 2021-06-27 DIAGNOSIS — R5383 Other fatigue: Secondary | ICD-10-CM | POA: Diagnosis not present

## 2021-06-27 NOTE — Patient Instructions (Signed)
Medication Instructions:  Continue all current medications.  Labwork: none  Testing/Procedures: none  Follow-Up: As needed.    Any Other Special Instructions Will Be Listed Below (If Applicable).  If you need a refill on your cardiac medications before your next appointment, please call your pharmacy.  

## 2021-06-27 NOTE — Addendum Note (Signed)
Addended by: Kerney Elbe on: 06/27/2021 03:20 PM   Modules accepted: Orders

## 2021-06-27 NOTE — Progress Notes (Signed)
Clinical Summary Erin Chavez is a 70 y.o.female seen today as a new consult, referred by Dr Simonne Come for the following medical problems.   Generalized fatigue -ongoing 5-6 months - nonspecific fatigue episodes - she reports no specific relation to exertion - typically comes on in the afternoon - checks bp's 90/s60s, pulse 101. HR can at times get up to 118 with episodes  - mild dizziness, no palpitations, feels weak all over - no SOB/DOE, no edema, no chest pains - walks about 1 mile, gets on treadmill x 30 minutes. No exertional symptoms.  04/2021 TSH 1.71   Past Medical History:  Diagnosis Date   Depression    History of hypertension    Hyperlipidemia    Hypothyroidism      Allergies  Allergen Reactions   Lipitor [Atorvastatin] Other (See Comments)    Felt like in a fog   Motrin [Ibuprofen] Swelling   Tobramycin    Sulfa Antibiotics Rash     Current Outpatient Medications  Medication Sig Dispense Refill   aspirin EC 81 MG tablet Take 81 mg by mouth daily. Swallow whole.     calcium carbonate (CALTRATE 600) 1500 (600 Ca) MG TABS tablet Take 600 mg of elemental calcium by mouth 2 (two) times daily with a meal.     Cholecalciferol (VITAMIN D) 50 MCG (2000 UT) tablet Take 2,000 Units by mouth daily.     glucosamine-chondroitin 500-400 MG tablet Take 1 tablet by mouth 3 (three) times daily.     levothyroxine (SYNTHROID) 75 MCG tablet Take 1 tablet by mouth daily at 6 (six) AM.     Multiple Vitamin (MULTIVITAMIN) tablet Take 1 tablet by mouth daily.     rosuvastatin (CRESTOR) 5 MG tablet Take 1 tablet by mouth every other day.     No current facility-administered medications for this visit.     No past surgical history on file.   Allergies  Allergen Reactions   Lipitor [Atorvastatin] Other (See Comments)    Felt like in a fog   Motrin [Ibuprofen] Swelling   Tobramycin    Sulfa Antibiotics Rash      Family History  Problem Relation Age of Onset    Hypertension Mother    Thyroid disease Mother    Heart attack Father    Hypertension Sister    Hypertension Brother    Asthma Brother    Hypertension Brother    Diabetes Brother      Social History Erin Chavez has no history on file for tobacco use. Erin Chavez has no history on file for alcohol use.   Review of Systems CONSTITUTIONAL: +fatigue HEENT: Eyes: No visual loss, blurred vision, double vision or yellow sclerae.No hearing loss, sneezing, congestion, runny nose or sore throat.  SKIN: No rash or itching.  CARDIOVASCULAR: per hpi RESPIRATORY: No shortness of breath, cough or sputum.  GASTROINTESTINAL: No anorexia, nausea, vomiting or diarrhea. No abdominal pain or blood.  GENITOURINARY: No burning on urination, no polyuria NEUROLOGICAL: No headache, dizziness, syncope, paralysis, ataxia, numbness or tingling in the extremities. No change in bowel or bladder control.  MUSCULOSKELETAL: No muscle, back pain, joint pain or stiffness.  LYMPHATICS: No enlarged nodes. No history of splenectomy.  PSYCHIATRIC: No history of depression or anxiety.  ENDOCRINOLOGIC: No reports of sweating, cold or heat intolerance. No polyuria or polydipsia.  Marland Kitchen   Physical Examination Today's Vitals   06/27/21 1324  BP: 124/68  Pulse: 80  SpO2: 96%  Weight: 186 lb (84.4  kg)  Height: 5' 6.5" (1.689 m)   Body mass index is 29.57 kg/m.  Gen: resting comfortably, no acute distress HEENT: no scleral icterus, pupils equal round and reactive, no palptable cervical adenopathy,  CV: RRR, no m/r/g no jvd Resp: Clear to auscultation bilaterally GI: abdomen is soft, non-tender, non-distended, normal bowel sounds, no hepatosplenomegaly MSK: extremities are warm, no edema.  Skin: warm, no rash Neuro:  no focal deficits Psych: appropriate affect     Assessment and Plan  Generalized fatigue -nonspecific generalized fatigue episodes. At time of pcp visit there was mention of post exercise  symptoms, but today she reports symptoms are not related to exercise. Seem to happen regularly in the afternoons. No chest pains, no palpitations, no SOB. Just generalized weakess and fatigue. She tolerates relatively high levels of exertion regularly on treadmill without exertional symptoms - history would not suggest a cardiac cause for her symptoms. During visit has fatigue and EKG shows NSR, normal vitals.  - At this time do not see indication for cardiac testing. If she were to have more exercise relatd symptoms or develop cardioulmonary symptoms could reconsider. Recent normal TSH, cbc, perhaps consider checking a B12  EKG today shows NSR  F/u as needed   Antoine Poche, M.D.

## 2021-08-27 DIAGNOSIS — E7800 Pure hypercholesterolemia, unspecified: Secondary | ICD-10-CM | POA: Diagnosis not present

## 2021-08-27 DIAGNOSIS — Z79899 Other long term (current) drug therapy: Secondary | ICD-10-CM | POA: Diagnosis not present

## 2021-08-27 DIAGNOSIS — R030 Elevated blood-pressure reading, without diagnosis of hypertension: Secondary | ICD-10-CM | POA: Diagnosis not present

## 2021-08-27 DIAGNOSIS — R7303 Prediabetes: Secondary | ICD-10-CM | POA: Diagnosis not present

## 2021-11-28 DIAGNOSIS — Z79899 Other long term (current) drug therapy: Secondary | ICD-10-CM | POA: Diagnosis not present

## 2021-11-28 DIAGNOSIS — E7800 Pure hypercholesterolemia, unspecified: Secondary | ICD-10-CM | POA: Diagnosis not present

## 2022-01-24 DIAGNOSIS — U071 COVID-19: Secondary | ICD-10-CM | POA: Diagnosis not present

## 2022-03-17 IMAGING — MG DIGITAL SCREENING BILAT W/ TOMO W/ CAD
6 of 10 series · 6 of 30 positions shown · non-contrast
Comparison: Previous exam(s).

ACR Breast Density Category a: The breast tissue is almost entirely
fatty.

CLINICAL DATA: Screening.

EXAM:
DIGITAL SCREENING BILATERAL MAMMOGRAM WITH TOMO AND CAD

[L CC synth-2D]
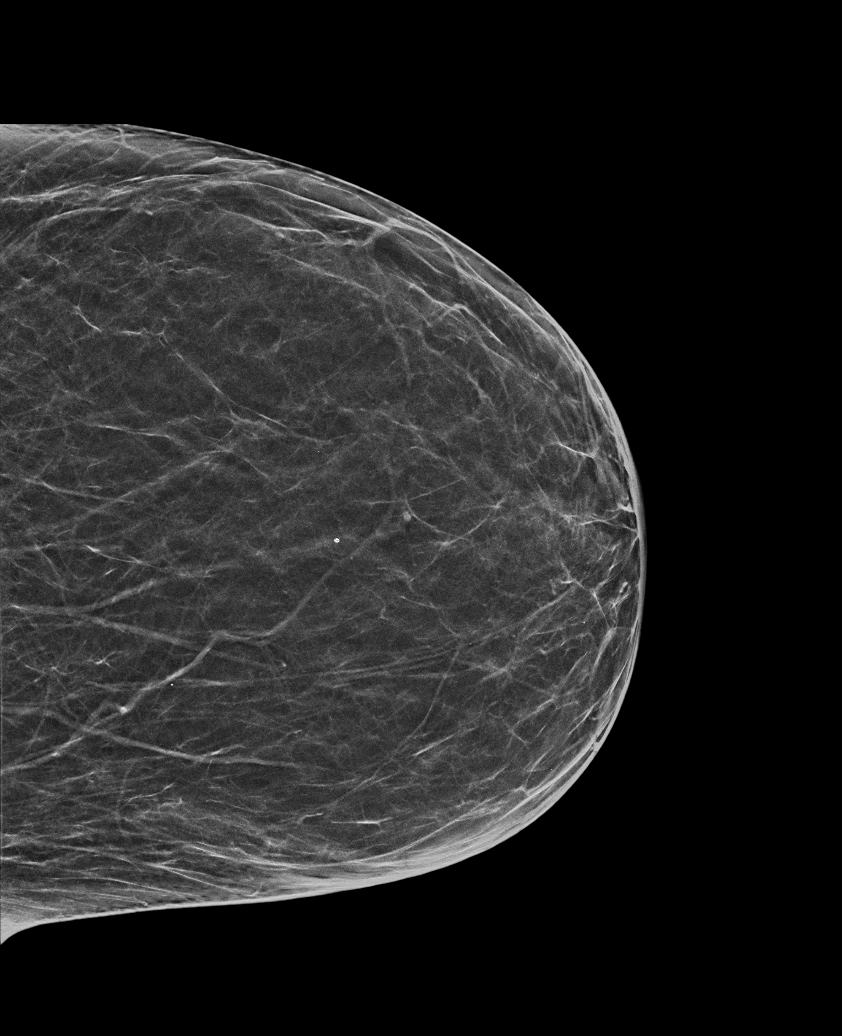

[L MLO synth-2D (1 of 2)]
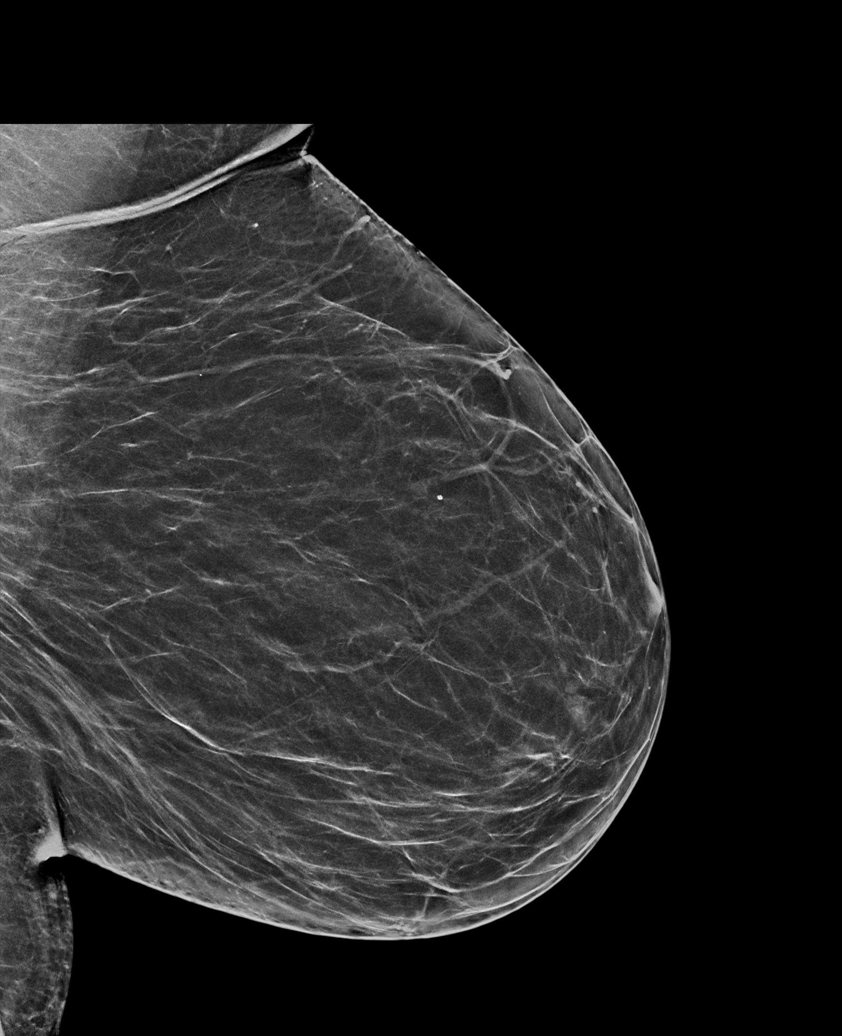

[L MLO synth-2D (2 of 2)]
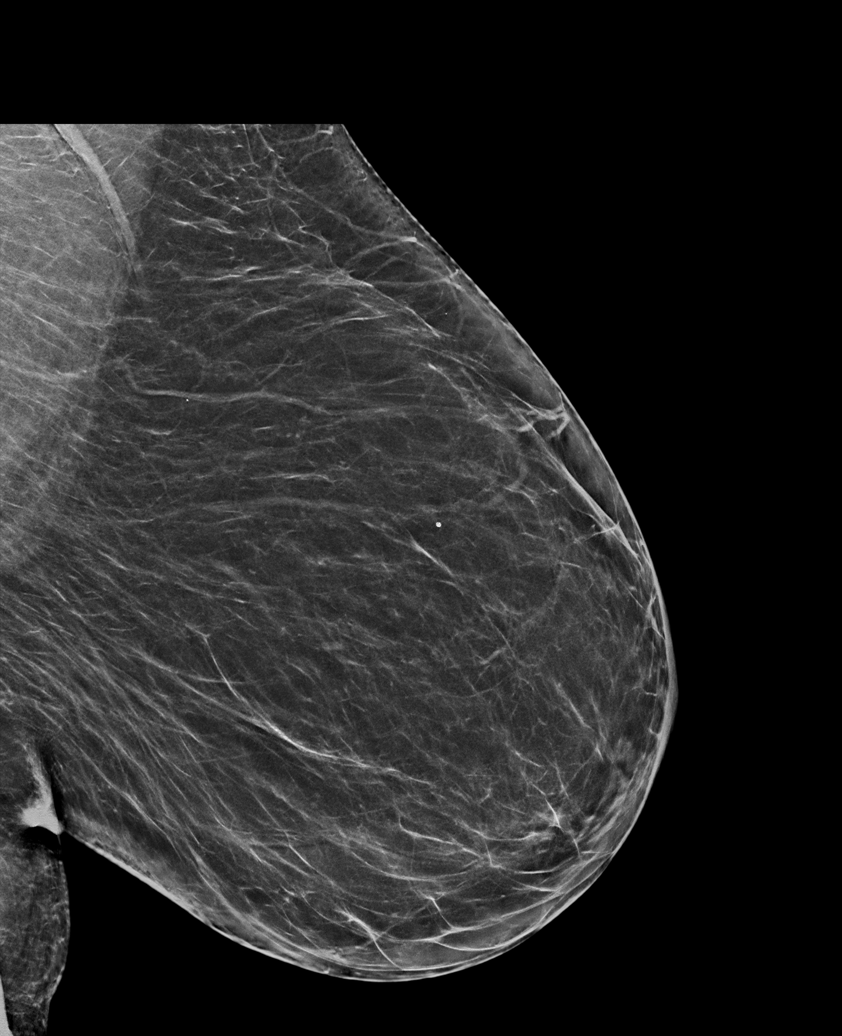

[R CC synth-2D]
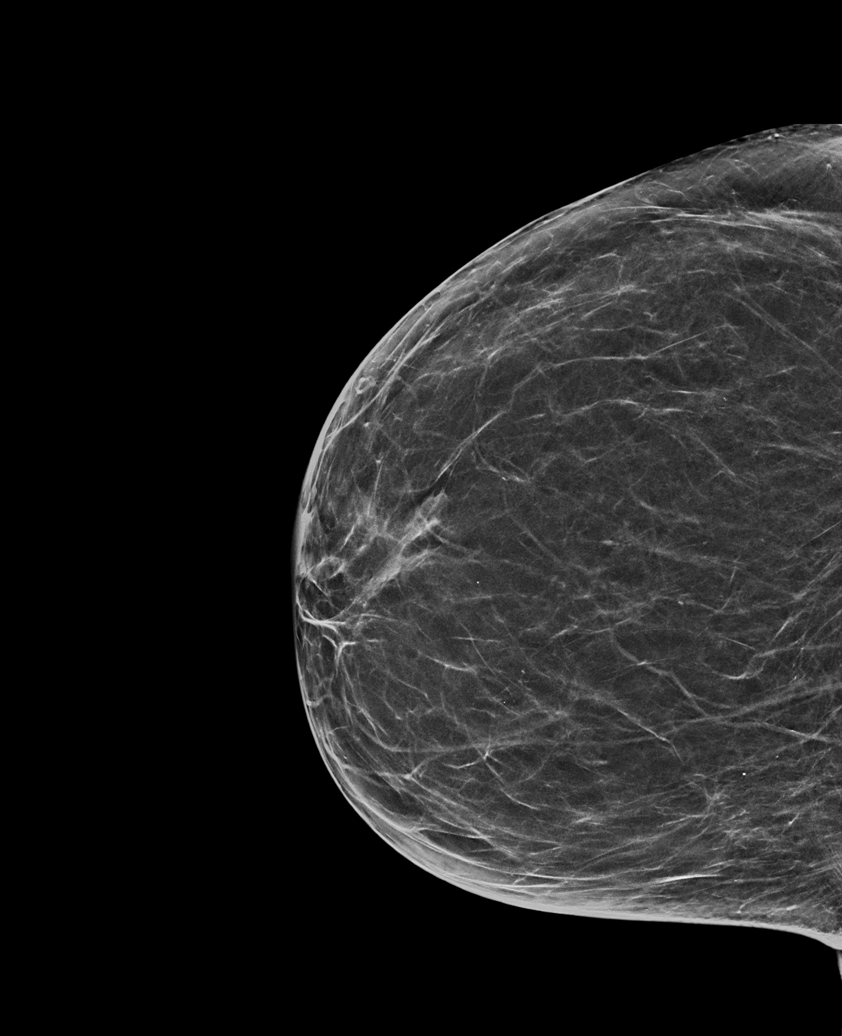

[R MLO synth-2D]
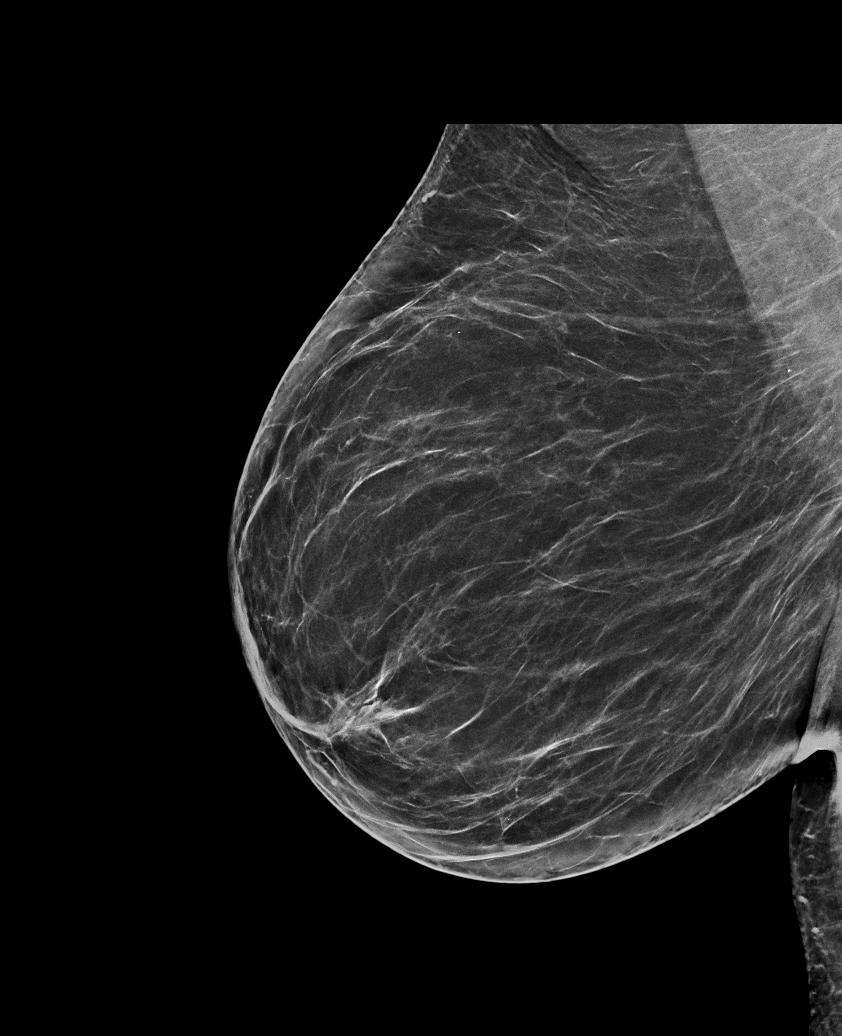

[L CC tomo · tomo slice 31/60.0]
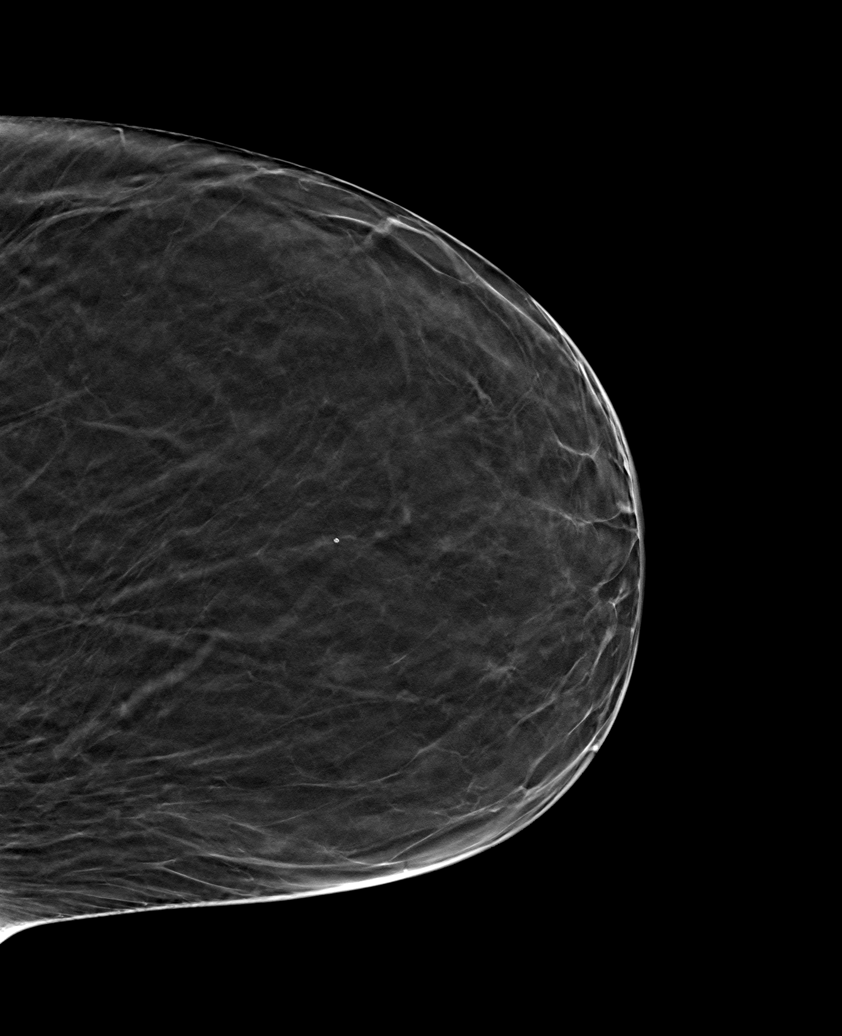

[6 of 30 positions shown; findings below may reference images not displayed]

FINDINGS: There are no findings suspicious for malignancy. Images were
processed with CAD.
IMPRESSION: No mammographic evidence of malignancy. A result letter of this
screening mammogram will be mailed directly to the patient.

RECOMMENDATION:
Screening mammogram in one year. (Code:8Y-Q-VVS)

BI-RADS CATEGORY  1: Negative.

## 2022-05-16 ENCOUNTER — Other Ambulatory Visit: Payer: Self-pay | Admitting: Family Medicine

## 2022-05-16 DIAGNOSIS — M8580 Other specified disorders of bone density and structure, unspecified site: Secondary | ICD-10-CM | POA: Diagnosis not present

## 2022-05-16 DIAGNOSIS — E039 Hypothyroidism, unspecified: Secondary | ICD-10-CM | POA: Diagnosis not present

## 2022-05-16 DIAGNOSIS — R221 Localized swelling, mass and lump, neck: Secondary | ICD-10-CM

## 2022-05-16 DIAGNOSIS — E7800 Pure hypercholesterolemia, unspecified: Secondary | ICD-10-CM | POA: Diagnosis not present

## 2022-05-16 DIAGNOSIS — R7303 Prediabetes: Secondary | ICD-10-CM | POA: Diagnosis not present

## 2022-05-16 DIAGNOSIS — Z6831 Body mass index (BMI) 31.0-31.9, adult: Secondary | ICD-10-CM | POA: Diagnosis not present

## 2022-05-20 DIAGNOSIS — Z0000 Encounter for general adult medical examination without abnormal findings: Secondary | ICD-10-CM | POA: Diagnosis not present

## 2022-05-20 DIAGNOSIS — Z1211 Encounter for screening for malignant neoplasm of colon: Secondary | ICD-10-CM | POA: Diagnosis not present

## 2022-05-20 DIAGNOSIS — Z1389 Encounter for screening for other disorder: Secondary | ICD-10-CM | POA: Diagnosis not present

## 2022-05-27 ENCOUNTER — Other Ambulatory Visit (HOSPITAL_COMMUNITY): Payer: Self-pay | Admitting: Family Medicine

## 2022-05-27 DIAGNOSIS — Z1231 Encounter for screening mammogram for malignant neoplasm of breast: Secondary | ICD-10-CM

## 2022-06-02 DIAGNOSIS — Z1211 Encounter for screening for malignant neoplasm of colon: Secondary | ICD-10-CM | POA: Diagnosis not present

## 2022-06-04 ENCOUNTER — Ambulatory Visit (HOSPITAL_COMMUNITY)
Admission: RE | Admit: 2022-06-04 | Discharge: 2022-06-04 | Disposition: A | Discharge status: Home / Self Care | Payer: PPO | Source: Ambulatory Visit | Attending: Family Medicine | Admitting: Family Medicine

## 2022-06-04 DIAGNOSIS — Z1231 Encounter for screening mammogram for malignant neoplasm of breast: Secondary | ICD-10-CM | POA: Diagnosis not present

## 2022-06-11 ENCOUNTER — Ambulatory Visit
Admission: RE | Admit: 2022-06-11 | Discharge: 2022-06-11 | Disposition: A | Discharge status: Home / Self Care | Payer: PPO | Source: Ambulatory Visit | Attending: Family Medicine | Admitting: Family Medicine

## 2022-06-11 DIAGNOSIS — R221 Localized swelling, mass and lump, neck: Secondary | ICD-10-CM

## 2022-06-11 DIAGNOSIS — E0789 Other specified disorders of thyroid: Secondary | ICD-10-CM | POA: Diagnosis not present

## 2022-06-11 MED ORDER — IOPAMIDOL (ISOVUE-300) INJECTION 61%
75.0000 mL | Freq: Once | INTRAVENOUS | Status: AC | PRN
  Administered 2022-06-11: 75 mL via INTRAVENOUS

## 2024-05-25 ENCOUNTER — Other Ambulatory Visit (HOSPITAL_COMMUNITY): Payer: Self-pay | Admitting: Family Medicine

## 2024-05-25 DIAGNOSIS — Z1231 Encounter for screening mammogram for malignant neoplasm of breast: Secondary | ICD-10-CM

## 2024-05-26 ENCOUNTER — Ambulatory Visit (HOSPITAL_COMMUNITY)
Admission: RE | Admit: 2024-05-26 | Discharge: 2024-05-26 | Disposition: A | Discharge status: Home / Self Care | Source: Ambulatory Visit | Attending: Family Medicine | Admitting: Family Medicine

## 2024-05-26 ENCOUNTER — Encounter (HOSPITAL_COMMUNITY): Payer: Self-pay

## 2024-05-26 DIAGNOSIS — Z1231 Encounter for screening mammogram for malignant neoplasm of breast: Secondary | ICD-10-CM | POA: Diagnosis present
